# Patient Record
Sex: Male | Born: 1955 | Race: White | Hispanic: No | Marital: Married | Smoking: Never smoker
Health system: Southern US, Community
[De-identification: ages and names within clinical notes are randomized; demographics above are authoritative.]

## PROBLEM LIST (undated history)

## (undated) DIAGNOSIS — E7800 Pure hypercholesterolemia, unspecified: Secondary | ICD-10-CM

## (undated) HISTORY — PX: TRIGGER FINGER RELEASE: SHX641

## (undated) HISTORY — PX: VASECTOMY: SHX75

## (undated) HISTORY — PX: BUNIONECTOMY: SHX129

---

## 1998-06-01 ENCOUNTER — Emergency Department (HOSPITAL_COMMUNITY): Admission: EM | Admit: 1998-06-01 | Discharge: 1998-06-01 | Payer: Self-pay | Admitting: Emergency Medicine

## 2008-05-20 ENCOUNTER — Emergency Department (HOSPITAL_BASED_OUTPATIENT_CLINIC_OR_DEPARTMENT_OTHER): Admission: EM | Admit: 2008-05-20 | Discharge: 2008-05-20 | Payer: Self-pay | Admitting: Emergency Medicine

## 2015-09-09 ENCOUNTER — Encounter: Payer: Self-pay | Admitting: Podiatry

## 2015-09-09 ENCOUNTER — Ambulatory Visit: Payer: 59

## 2015-09-09 ENCOUNTER — Ambulatory Visit (INDEPENDENT_AMBULATORY_CARE_PROVIDER_SITE_OTHER): Payer: 59

## 2015-09-09 ENCOUNTER — Ambulatory Visit (INDEPENDENT_AMBULATORY_CARE_PROVIDER_SITE_OTHER): Payer: 59 | Admitting: Podiatry

## 2015-09-09 VITALS — BP 112/76 | HR 76 | Resp 16 | Ht 70.0 in | Wt 190.0 lb

## 2015-09-09 DIAGNOSIS — M1000 Idiopathic gout, unspecified site: Secondary | ICD-10-CM

## 2015-09-09 DIAGNOSIS — M21619 Bunion of unspecified foot: Secondary | ICD-10-CM

## 2015-09-09 MED ORDER — ALLOPURINOL 100 MG PO TABS: 100.0000 mg | ORAL_TABLET | Freq: Every day | ORAL | Status: AC

## 2015-09-09 NOTE — Patient Instructions (Signed)

## 2015-09-09 NOTE — Progress Notes (Signed)
   Subjective:    Patient ID: Dylan Rodriguez, male    DOB: 16-Oct-1956, 59 y.o.   MRN: [id number]  HPI Patient presents with bilateral bunions. Pt stated, "Right foot hurts"; x10 years.  Review of Systems  Psychiatric/Behavioral: The patient is nervous/anxious.   All other systems reviewed and are negative.      Objective:   Physical Exam        Assessment & Plan:

## 2015-09-11 NOTE — Progress Notes (Signed)
Subjective:     Patient ID: Dylan Rodriguez, male   DOB: March 09, 1956, 59 y.o.   MRN: [id number]  HPI patient presents stating I have a bunion deformity of my right foot that bothers me and also I get a proximally 6 times year all get redness and irritation around my big toe joint right over left foot. I do have family history of structural bunion deformity   Review of Systems  All other systems reviewed and are negative.      Objective:   Physical Exam  Constitutional: He is oriented to person, place, and time.  Cardiovascular: Intact distal pulses.   Musculoskeletal: Normal range of motion.  Neurological: He is oriented to person, place, and time.  Skin: Skin is warm.  Nursing note and vitals reviewed.  neurovascular status found to be intact muscle strength adequate range of motion within normal limits. Patient's found to have redness and pain around the first metatarsal head right with enlargement of the joint surface and deviation the big toe against the second toe. He also has history of occasional flareups and redness around this joint surface which gets painful when pressed. 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 I have recommended at this time structural bunion correction due to long-standing nature and pain along with starting low-dose allopurinol to see results and make a decision on what we'll be best. Patient does 1 structural bunion correction and will reappoint for consult to discuss what would be required to fix this deformity

## 2015-09-25 ENCOUNTER — Encounter: Payer: Self-pay | Admitting: Podiatry

## 2015-09-25 ENCOUNTER — Ambulatory Visit (INDEPENDENT_AMBULATORY_CARE_PROVIDER_SITE_OTHER): Payer: 59 | Admitting: Podiatry

## 2015-09-25 DIAGNOSIS — M21619 Bunion of unspecified foot: Secondary | ICD-10-CM

## 2015-09-25 MED ORDER — METHYLPREDNISOLONE 4 MG PO TBPK
ORAL_TABLET | ORAL | Status: DC
Start: 2015-09-25 — End: 2015-10-02

## 2015-09-25 MED ORDER — ALLOPURINOL 100 MG PO TABS: 150.0000 mg | ORAL_TABLET | Freq: Every day | ORAL | Status: DC

## 2015-09-25 NOTE — Patient Instructions (Signed)
Pre-Operative Instructions  Congratulations, you have decided to take an important step to improving your quality of life.  You can be assured that the doctors of [facility] will be with you every step of the way.  1. Plan to be at the surgery center/hospital at least 1 (one) hour prior to your scheduled time unless otherwise directed by the surgical center/hospital staff.  You must have a responsible adult accompany you, remain during the surgery and drive you home.  Make sure you have directions to the surgical center/hospital and know how to get there on time. 2. For hospital based surgery you will need to obtain a history and physical form from your family physician within 1 month prior to the date of surgery- we will give you a form for you primary physician.  3. We make every effort to accommodate the date you request for surgery.  There are however, times where surgery dates or times have to be moved.  We will contact you as soon as possible if a change in schedule is required.   4. No Aspirin/Ibuprofen for one week before surgery.  If you are on aspirin, any non-steroidal anti-inflammatory medications (Mobic, Aleve, Ibuprofen) you should stop taking it 7 days prior to your surgery.  You make take Tylenol  For pain prior to surgery.  5. Medications- If you are taking daily heart and blood pressure medications, seizure, reflux, allergy, asthma, anxiety, pain or diabetes medications, make sure the surgery center/hospital is aware before the day of surgery so they may notify you which medications to take or avoid the day of surgery. 6. No food or drink after midnight the night before surgery unless directed otherwise by surgical center/hospital staff. 7. No alcoholic beverages 24 hours prior to surgery.  No smoking 24 hours prior to or 24 hours after surgery. 8. Wear loose pants or shorts- loose enough to fit over bandages, boots, and casts. 9. No slip on shoes, sneakers are best. 10. Bring  your boot with you to the surgery center/hospital.  Also bring crutches or a walker if your physician has prescribed it for you.  If you do not have this equipment, it will be provided for you after surgery. 11. If you have not been contracted by the surgery center/hospital by the day before your surgery, call to confirm the date and time of your surgery. 12. Leave-time from work may vary depending on the type of surgery you have.  Appropriate arrangements should be made prior to surgery with your employer. 13. Prescriptions will be provided immediately following surgery by your doctor.  Have these filled as soon as possible after surgery and take the medication as directed. 14. Remove nail polish on the operative foot. 15. Wash the night before surgery.  The night before surgery wash the foot and leg well with the antibacterial soap provided and water paying special attention to beneath the toenails and in between the toes.  Rinse thoroughly with water and dry well with a towel.  Perform this wash unless told not to do so by your physician.  Enclosed: 1 Ice pack (please put in freezer the night before surgery)   1 Hibiclens skin cleaner   Pre-op Instructions  If you have any questions regarding the instructions, do not hesitate to call our office.  [place]: [address] [place], [place] [address] [phone]  [place]: [address] [place], [place] [address] [phone]  [place]: [address]  [place], [place] [address] [phone]  Dr. Richard   Tuchman DPM, Dr. Norman Regal DPM Dr. Richard Sikora DPM, Dr. M. Todd Hyatt DPM, Dr. Kathryn Egerton DPM 

## 2015-09-25 NOTE — Progress Notes (Signed)
Subjective:     Patient ID: Dylan Rodriguez, male   DOB: [birth year], 59 y.o.   MRN: [id number]  HPI patient states I'm ready to get this bunion fixed as it's been sore and I have had a repeated gout attack right second toe. Patient states that he has tried wider shoes she's tried soaks and he's tried injection treatment without relief of symptoms around the big toe joint with large bunion deformity noted   Review of Systems     Objective:   Physical Exam Neurovascular status intact muscle strength adequate with large hyperostosis medial aspect first metatarsal head right that's red when pressed and inflamed second digit distal interphalangeal joint that is localized and is not currently tender but is quite discolored    Assessment:     Possibility for rebound gout attack versus inflammatory or other unknown condition second toe right with structural bunion deformity right that's failed to respond to conservative care    Plan:     Reviewed x-rays and condition and discussed at great length and saturations for treatment including surgical intervention. Patient wants this fixed surgically and I reviewed alternative treatments complications and the fact that he still may develop consistent gout attacks in the future. I am getting increase him to 150 mg on his allopurinol and I placed him on a steroid pack at this time to try to reduce inflammation and I'm also sending him out for a uric acid and arthritic profile along with CBC and differential. Patient will be seen back in 6 days and is scheduled for surgery in December and I did dispense air fracture walker at the current time  X-ray report indicates large structural bunion deformity right with elevation of the ankle between the first and second metatarsal of 15 and deviation of the hallux against the second toe

## 2015-09-26 LAB — ANA: Anti Nuclear Antibody(ANA): NEGATIVE

## 2015-09-26 LAB — CBC WITH DIFFERENTIAL/PLATELET
BASOS ABS: 0.1 10*3/uL (ref 0.0–0.1)
BASOS PCT: 1 % (ref 0–1)
EOS ABS: 0.2 10*3/uL (ref 0.0–0.7)
Eosinophils Relative: 3 % (ref 0–5)
HCT: 49.8 % (ref 39.0–52.0)
Hemoglobin: 16.6 g/dL (ref 13.0–17.0)
LYMPHS ABS: 1.8 10*3/uL (ref 0.7–4.0)
Lymphocytes Relative: 31 % (ref 12–46)
MCH: 31.3 pg (ref 26.0–34.0)
MCHC: 33.3 g/dL (ref 30.0–36.0)
MCV: 94 fL (ref 78.0–100.0)
MPV: 10.7 fL (ref 8.6–12.4)
Monocytes Absolute: 0.7 10*3/uL (ref 0.1–1.0)
Monocytes Relative: 12 % (ref 3–12)
NEUTROS PCT: 53 % (ref 43–77)
Neutro Abs: 3 10*3/uL (ref 1.7–7.7)
PLATELETS: 234 10*3/uL (ref 150–400)
RBC: 5.3 MIL/uL (ref 4.22–5.81)
RDW: 13.4 % (ref 11.5–15.5)
WBC: 5.7 10*3/uL (ref 4.0–10.5)

## 2015-09-26 LAB — RHEUMATOID FACTOR

## 2015-09-26 LAB — SEDIMENTATION RATE: SED RATE: 4 mm/h (ref 0–20)

## 2015-09-26 LAB — URIC ACID: Uric Acid, Serum: 6.8 mg/dL (ref 4.0–7.8)

## 2015-10-01 ENCOUNTER — Telehealth: Payer: Self-pay | Admitting: *Deleted

## 2015-10-01 NOTE — Telephone Encounter (Signed)
Authorization was obtained for surgery scheduled for 10/15/2015 for cpt 28296.  Authorization number is [id number].  Authorization number was faxed to Caren Griffins at [facility].

## 2015-10-02 ENCOUNTER — Encounter: Payer: Self-pay | Admitting: Podiatry

## 2015-10-02 ENCOUNTER — Ambulatory Visit (INDEPENDENT_AMBULATORY_CARE_PROVIDER_SITE_OTHER): Payer: 59 | Admitting: Podiatry

## 2015-10-02 VITALS — BP 119/81 | HR 61 | Resp 16

## 2015-10-02 DIAGNOSIS — M21619 Bunion of unspecified foot: Secondary | ICD-10-CM | POA: Diagnosis not present

## 2015-10-02 DIAGNOSIS — M1000 Idiopathic gout, unspecified site: Secondary | ICD-10-CM | POA: Diagnosis not present

## 2015-10-03 NOTE — Progress Notes (Signed)
Subjective:     Patient ID: Dylan Rodriguez, male   DOB: 1956-07-22, 59 y.o.   MRN: [id number]  HPI patient presents stating my second toe is doing much better but I wanted to review blood work and again go over the surgery I'm having   Review of Systems     Objective:   Physical Exam Neurovascular status intact with large hyperostosis with redness medial aspect first metatarsal head right with pain and a well-healed second digit distal joint that has no current redness or swelling    Assessment:     Probability for some inflammatory systemic condition that appears to be under good control now    Plan:     Reviewed condition and at this time reviewed blood work indicating the uric acid and all other inflammatory components were within normal limits. Patient will have surgery done and I reviewed again structural bunion correction and everything involved with it and he is scheduled for this surgery to be done for weeks

## 2015-10-15 ENCOUNTER — Encounter: Payer: Self-pay | Admitting: Podiatry

## 2015-10-15 DIAGNOSIS — M2011 Hallux valgus (acquired), right foot: Secondary | ICD-10-CM | POA: Diagnosis not present

## 2015-10-25 ENCOUNTER — Ambulatory Visit (INDEPENDENT_AMBULATORY_CARE_PROVIDER_SITE_OTHER): Payer: 59 | Admitting: Podiatry

## 2015-10-25 ENCOUNTER — Ambulatory Visit: Payer: Self-pay

## 2015-10-25 VITALS — Temp 96.9°F

## 2015-10-25 DIAGNOSIS — Z9889 Other specified postprocedural states: Secondary | ICD-10-CM

## 2015-10-25 DIAGNOSIS — M21619 Bunion of unspecified foot: Secondary | ICD-10-CM

## 2015-10-25 MED ORDER — HYDROCODONE-ACETAMINOPHEN 10-325 MG PO TABS: 1.0000 | ORAL_TABLET | Freq: Three times a day (TID) | ORAL | Status: DC | PRN

## 2015-11-03 NOTE — Progress Notes (Signed)
Subjective:     Patient ID: Dylan Rodriguez, male   DOB: 1955-11-14, 59 y.o.   MRN: [id number]  HPI patient states she's doing real well with the right foot with diminished discomfort swelling and able to walk without significant pain or swelling   Review of Systems     Objective:   Physical Exam  neurovascular status intact negative Homans sign noted with well coapted incision site right first metatarsal with good alignment noted    Assessment:      doing well with osteotomy first metatarsal right with gout the digits show up on the pathology    Plan:      H&P reviewed and allow patient to gradually increase activity over the next few weeks but continue immobilization compression elevation. Reappoint 4 weeks or earlier if any issues should occur

## 2015-11-07 NOTE — Progress Notes (Signed)
DOS 10/15/2015 [place] bunionectomy (cutting and moving bone with pin fixation or screw) right foot.

## 2015-11-22 ENCOUNTER — Ambulatory Visit (INDEPENDENT_AMBULATORY_CARE_PROVIDER_SITE_OTHER): Payer: 59 | Admitting: Podiatry

## 2015-11-22 ENCOUNTER — Ambulatory Visit (INDEPENDENT_AMBULATORY_CARE_PROVIDER_SITE_OTHER): Payer: 59

## 2015-11-22 DIAGNOSIS — Z9889 Other specified postprocedural states: Secondary | ICD-10-CM

## 2015-11-22 DIAGNOSIS — M21619 Bunion of unspecified foot: Secondary | ICD-10-CM | POA: Diagnosis not present

## 2015-11-24 NOTE — Progress Notes (Signed)
Subjective:     Patient ID: Dylan Rodriguez, male   DOB: 30-Mar-1956, 60 y.o.   MRN: [id number]  HPI patient states that I'm doing very well with minimal swelling or pain   Review of Systems     Objective:   Physical Exam Neurological status intact negative Homans sign well coapted site first metatarsal with wound edges in good alignment and first metatarsal bending well    Assessment:     Doing well post surgery right    Plan:     X-ray reviewed and allow patient to return to normal activity explaining continued elevation compression as needed. Reappoint 4 weeks

## 2015-11-25 ENCOUNTER — Other Ambulatory Visit: Payer: 59

## 2016-01-03 ENCOUNTER — Ambulatory Visit (INDEPENDENT_AMBULATORY_CARE_PROVIDER_SITE_OTHER): Payer: 59 | Admitting: Podiatry

## 2016-01-03 ENCOUNTER — Encounter: Payer: Self-pay | Admitting: Podiatry

## 2016-01-03 DIAGNOSIS — M21619 Bunion of unspecified foot: Secondary | ICD-10-CM

## 2016-01-03 DIAGNOSIS — Z9889 Other specified postprocedural states: Secondary | ICD-10-CM

## 2016-01-05 NOTE — Progress Notes (Signed)
Subjective:     Patient ID: Dylan Rodriguez, male   DOB: Apr 22, 1956, 60 y.o.   MRN: [id number]  HPI patient states I'm doing great with my right foot   Review of Systems     Objective:   Physical Exam Neurovascular status intact muscle strength adequate with well-healing surgical site right first metatarsal with wound edges well coapted and hallux in rectus position with good range of motion    Assessment:     Doing well post osteotomy right first metatarsal    Plan:     Advised on physical therapy for this to be continued reviewed final x-rays and allow patient to return to normal shoe gear and will be seen back as needed  Report indicate good healing of the osteotomy with good alignment and pins in place

## 2017-01-03 ENCOUNTER — Emergency Department (HOSPITAL_BASED_OUTPATIENT_CLINIC_OR_DEPARTMENT_OTHER): Payer: Worker's Compensation

## 2017-01-03 ENCOUNTER — Encounter (HOSPITAL_BASED_OUTPATIENT_CLINIC_OR_DEPARTMENT_OTHER): Payer: Self-pay | Admitting: Emergency Medicine

## 2017-01-03 ENCOUNTER — Emergency Department (HOSPITAL_BASED_OUTPATIENT_CLINIC_OR_DEPARTMENT_OTHER)
Admission: EM | Admit: 2017-01-03 | Discharge: 2017-01-03 | Disposition: A | Discharge status: Home / Self Care | Payer: Worker's Compensation | Attending: Emergency Medicine | Admitting: Emergency Medicine

## 2017-01-03 DIAGNOSIS — S59901A Unspecified injury of right elbow, initial encounter: Secondary | ICD-10-CM | POA: Diagnosis present

## 2017-01-03 DIAGNOSIS — S5001XA Contusion of right elbow, initial encounter: Secondary | ICD-10-CM | POA: Diagnosis not present

## 2017-01-03 DIAGNOSIS — W1809XA Striking against other object with subsequent fall, initial encounter: Secondary | ICD-10-CM | POA: Diagnosis not present

## 2017-01-03 DIAGNOSIS — Y999 Unspecified external cause status: Secondary | ICD-10-CM | POA: Diagnosis not present

## 2017-01-03 DIAGNOSIS — M25421 Effusion, right elbow: Secondary | ICD-10-CM

## 2017-01-03 DIAGNOSIS — Y939 Activity, unspecified: Secondary | ICD-10-CM | POA: Diagnosis not present

## 2017-01-03 DIAGNOSIS — Y929 Unspecified place or not applicable: Secondary | ICD-10-CM | POA: Diagnosis not present

## 2017-01-03 MED ORDER — SULFAMETHOXAZOLE-TRIMETHOPRIM 800-160 MG PO TABS: 1.0000 | ORAL_TABLET | Freq: Two times a day (BID) | ORAL | 0 refills | Status: AC

## 2017-01-03 NOTE — ED Provider Notes (Signed)
[place] DEPT [facility] Provider Note   CSN: [id number] Arrival date & time: 01/03/17  1030     History   Chief Complaint Chief Complaint  Patient presents with  . Laceration    HPI Dylan Rodriguez is a 61 y.o. male who presents to the ED for possible wound infection. Patient reports that he injured his elbow a few days ago and it was closed with dermabond. Today he has redness that he is afraid is infection.  HPI  History reviewed. No pertinent past medical history.  There are no active problems to display for this patient.   Past Surgical History:  Procedure Laterality Date  . BUNIONECTOMY Right   . VASECTOMY         Home Medications    Prior to Admission medications   Medication Sig Start Date End Date Taking? Authorizing Provider  pravastatin (PRAVACHOL) 20 MG tablet Take 20 mg by mouth daily. 07/30/15  Yes Historical Provider, MD  sertraline (ZOLOFT) 25 MG tablet Take 25 mg by mouth daily. 07/29/15  Yes Historical Provider, MD  allopurinol (ZYLOPRIM) 100 MG tablet Take 1 tablet (100 mg total) by mouth daily. 09/09/15   Wallene Huh, DPM  allopurinol (ZYLOPRIM) 100 MG tablet Take 1.5 tablets (150 mg total) by mouth daily. 09/25/15   Wallene Huh, DPM  HYDROcodone-acetaminophen (NORCO) 10-325 MG tablet Take 1 tablet by mouth every 8 (eight) hours as needed. 10/25/15   Wallene Huh, DPM  oxyCODONE-acetaminophen (PERCOCET) 10-325 MG tablet Take 1 tablet by mouth every 4 (four) hours as needed for pain.    Wallene Huh, DPM  sulfamethoxazole-trimethoprim (BACTRIM DS,SEPTRA DS) 800-160 MG tablet Take 1 tablet by mouth 2 (two) times daily. 01/03/17 01/10/17  Dailyn Reith Bunnie Pion, NP    Family History No family history on file.  Social History Social History  Substance Use Topics  . Smoking status: Never Smoker  . Smokeless tobacco: Never Used  . Alcohol use No     Allergies   Patient has no known allergies.   Review of Systems Review of Systems   Physical  Exam Updated Vital Signs BP 142/88 (BP Location: Right Arm)   Pulse 64   Temp 98.5 F (36.9 C) (Oral)   Resp 18   SpO2 97%   Physical Exam  Constitutional: He appears well-developed and well-nourished. No distress.  HENT:  Head: Normocephalic.  Eyes: EOM are normal.  Neck: Neck supple.  Cardiovascular: Normal rate.   Pulmonary/Chest: Effort normal.  Musculoskeletal: Normal range of motion.       Right elbow: He exhibits swelling. Lacerations: abrasion. Tenderness found. Olecranon process tenderness noted.  Neurological: He is alert.  Skin: Skin is warm and dry.  Psychiatric: He has a normal mood and affect.  Nursing note and vitals reviewed.    ED Treatments / Results  Labs (all labs ordered are listed, but only abnormal results are displayed) Labs Reviewed - No data to display  Radiology Dg Elbow Complete Right  Result Date: 01/03/2017 CLINICAL DATA:  Was injured on Monday and hit right elbow. Wound to right elbow. Wound is red and hot per patient. Right elbow swelling. EXAM: RIGHT ELBOW - COMPLETE 3+ VIEW COMPARISON:  None. FINDINGS: No fracture.  No bone lesion. The elbow joint is normally spaced and aligned. No arthropathic change. No joint effusion. There is posterior soft tissue swelling.  No soft tissue air. No evidence of osteomyelitis. IMPRESSION: 1. No fracture or elbow joint abnormality. No evidence of osteomyelitis.  2. Posterior soft tissue swelling.  No soft tissue air. Electronically Signed   By: Lajean Manes M.D.   On: 01/03/2017 12:17    Procedures Procedures (including critical care time)  Medications Ordered in ED Medications - No data to display   Initial Impression / Assessment and Plan / ED Course  I have reviewed the triage vital signs and the nursing notes.  Pertinent imaging results that were available during my care of the patient were reviewed by me and considered in my medical decision making (see chart for details). Dr. Johnney Killian in to  examine the patient and agrees with a/p.   Final Clinical Impressions(s) / ED Diagnoses  61 y.o. male with swelling and tenderness of the right elbow s/p injury a few days ago stable for d/c without fever. X-ray without fracture, dislocation and no osteo. Will treat for traumatic bursitis and cover for infection. Discussed with patient and he agrees with plan.  Final diagnoses:  Contusion of right elbow, initial encounter  Swelling of right elbow    New Prescriptions New Prescriptions   SULFAMETHOXAZOLE-TRIMETHOPRIM (BACTRIM DS,SEPTRA DS) 800-160 MG TABLET    Take 1 tablet by mouth 2 (two) times daily.     [address] [place], NP 01/03/17 [place], MD 01/13/17 [phone]

## 2017-01-03 NOTE — ED Triage Notes (Addendum)
Was injured on Monday and hit right elbow. No LOC, concerned that right elbow wound is infected because area is red, was treated with dermabond. Scabbed area noted to right elbow, with swelling noted

## 2017-01-03 NOTE — ED Notes (Signed)
Patient transported to Ultrasound 

## 2017-01-15 ENCOUNTER — Encounter (HOSPITAL_BASED_OUTPATIENT_CLINIC_OR_DEPARTMENT_OTHER): Payer: Self-pay | Admitting: *Deleted

## 2017-01-15 ENCOUNTER — Emergency Department (HOSPITAL_BASED_OUTPATIENT_CLINIC_OR_DEPARTMENT_OTHER)
Admission: EM | Admit: 2017-01-15 | Discharge: 2017-01-15 | Disposition: A | Discharge status: Home / Self Care | Payer: 59 | Attending: Emergency Medicine | Admitting: Emergency Medicine

## 2017-01-15 DIAGNOSIS — S0501XA Injury of conjunctiva and corneal abrasion without foreign body, right eye, initial encounter: Secondary | ICD-10-CM | POA: Insufficient documentation

## 2017-01-15 DIAGNOSIS — W228XXA Striking against or struck by other objects, initial encounter: Secondary | ICD-10-CM | POA: Insufficient documentation

## 2017-01-15 DIAGNOSIS — Y9389 Activity, other specified: Secondary | ICD-10-CM | POA: Insufficient documentation

## 2017-01-15 DIAGNOSIS — H43391 Other vitreous opacities, right eye: Secondary | ICD-10-CM | POA: Diagnosis not present

## 2017-01-15 DIAGNOSIS — Z79899 Other long term (current) drug therapy: Secondary | ICD-10-CM | POA: Diagnosis not present

## 2017-01-15 DIAGNOSIS — Y929 Unspecified place or not applicable: Secondary | ICD-10-CM | POA: Diagnosis not present

## 2017-01-15 DIAGNOSIS — Y999 Unspecified external cause status: Secondary | ICD-10-CM | POA: Insufficient documentation

## 2017-01-15 DIAGNOSIS — S0591XA Unspecified injury of right eye and orbit, initial encounter: Secondary | ICD-10-CM | POA: Diagnosis not present

## 2017-01-15 DIAGNOSIS — H04129 Dry eye syndrome of unspecified lacrimal gland: Secondary | ICD-10-CM | POA: Diagnosis not present

## 2017-01-15 DIAGNOSIS — H1031 Unspecified acute conjunctivitis, right eye: Secondary | ICD-10-CM | POA: Diagnosis not present

## 2017-01-15 HISTORY — DX: Pure hypercholesterolemia, unspecified: E78.00

## 2017-01-15 MED ORDER — TETRACAINE HCL 0.5 % OP SOLN
OPHTHALMIC | Status: AC
  Administered 2017-01-15: 1 [drp]
  Filled 2017-01-15: qty 4

## 2017-01-15 MED ORDER — FLUORESCEIN SODIUM 0.6 MG OP STRP
1.0000 | ORAL_STRIP | Freq: Once | OPHTHALMIC | Status: AC
  Administered 2017-01-15: 1 via OPHTHALMIC

## 2017-01-15 MED ORDER — TETRACAINE HCL 0.5 % OP SOLN
1.0000 [drp] | Freq: Once | OPHTHALMIC | Status: AC
  Administered 2017-01-15: 1 [drp] via OPHTHALMIC

## 2017-01-15 NOTE — ED Triage Notes (Signed)
He was cutting wood and got a FB in his left eye. Tearing, redness and pain. Blurred vision. No relief with heat, irrigation.

## 2017-01-15 NOTE — ED Provider Notes (Signed)
[place] DEPT [facility] Provider Note   CSN: [id number] Arrival date & time: 01/15/17  1207     History   Chief Complaint Chief Complaint  Patient presents with  . Eye Problem    HPI Dylan Rodriguez is a 61 y.o. male.  HPI   Dylan Rodriguez is a 61 y.o. male, patient with no pertinent past medical history, presenting to the ED with a right eye injury that occurred shortly prior to arrival. Patient states he was cutting wood with a chain saw when a piece of wood flew up into his right eye. Patient was wearing sunglasses at the time. He had a foreign body sensation in the eye. He attempted to rinse the eye out and also applied lubrication drops, without relief. Pain is sharp/burning, 4 out of 10, nonradiating. He denies contact lens wear or eye surgery. Denies visual acuity changes, other injuries, or any other complaints.   Past Medical History:  Diagnosis Date  . High cholesterol     There are no active problems to display for this patient.   Past Surgical History:  Procedure Laterality Date  . BUNIONECTOMY Right   . VASECTOMY         Home Medications    Prior to Admission medications   Medication Sig Start Date End Date Taking? Authorizing Provider  pravastatin (PRAVACHOL) 20 MG tablet Take 20 mg by mouth daily. 07/30/15  Yes Historical Provider, MD  allopurinol (ZYLOPRIM) 100 MG tablet Take 1 tablet (100 mg total) by mouth daily. 09/09/15   Wallene Huh, DPM  allopurinol (ZYLOPRIM) 100 MG tablet Take 1.5 tablets (150 mg total) by mouth daily. 09/25/15   Wallene Huh, DPM  HYDROcodone-acetaminophen (NORCO) 10-325 MG tablet Take 1 tablet by mouth every 8 (eight) hours as needed. 10/25/15   Wallene Huh, DPM  oxyCODONE-acetaminophen (PERCOCET) 10-325 MG tablet Take 1 tablet by mouth every 4 (four) hours as needed for pain.    Wallene Huh, DPM  sertraline (ZOLOFT) 25 MG tablet Take 25 mg by mouth daily. 07/29/15   Historical Provider, MD    Family History No  family history on file.  Social History Social History  Substance Use Topics  . Smoking status: Never Smoker  . Smokeless tobacco: Never Used  . Alcohol use No     Allergies   Patient has no known allergies.   Review of Systems Review of Systems  Eyes: Positive for pain and redness. Negative for visual disturbance.  Neurological: Negative for headaches.     Physical Exam Updated Vital Signs BP 129/91   Pulse (!) 58   Temp 98.2 F (36.8 C) (Oral)   Resp 20   Ht 5\' 10"  (1.778 m)   Wt 86.2 kg   SpO2 99%   BMI 27.26 kg/m   Physical Exam  Constitutional: He appears well-developed and well-nourished. No distress.  HENT:  Head: Normocephalic and atraumatic.  Eyes: EOM are normal. Pupils are equal, round, and reactive to light.  Scleral injection in right eye. Tearing noted, but no abnormal discharge.  No contact lenses in place.  Woods Lamp exam shows increased uptake of fluorescein around the 9 o'clock position of the right cornea. Slit lamp exam was also performed with signs of corneal abrasion confirmed at 9:00 position of the right cornea. No noted signs of corneal ulcer, iritis, anterior chamber damage, foreign bodies, or globe damage.  Tono-Pen values: Right eye: 16  Left eye: 15    Visual Acuity  Right Eye Distance: 20/20 Left Eye Distance: 20/15 Bilateral Distance: 20/13  Right Eye Near:   Left Eye Near:    Bilateral Near:      Neck: Neck supple.  Cardiovascular: Normal rate and regular rhythm.   Pulmonary/Chest: Effort normal.  Neurological: He is alert.  Skin: Skin is warm and dry. He is not diaphoretic.  Psychiatric: He has a normal mood and affect. His behavior is normal.  Nursing note and vitals reviewed.    ED Treatments / Results  Labs (all labs ordered are listed, but only abnormal results are displayed) Labs Reviewed - No data to display  EKG  EKG Interpretation None       Radiology No results found.  Procedures Procedures  (including critical care time)  Medications Ordered in ED Medications  tetracaine (PONTOCAINE) 0.5 % ophthalmic solution (1 drop  Given 01/15/17 1231)  tetracaine (PONTOCAINE) 0.5 % ophthalmic solution 1 drop (1 drop Right Eye Given 01/15/17 1232)  fluorescein ophthalmic strip 1 strip (1 strip Left Eye Given 01/15/17 1359)     Initial Impression / Assessment and Plan / ED Course  I have reviewed the triage vital signs and the nursing notes.  Pertinent labs & imaging results that were available during my care of the patient were reviewed by me and considered in my medical decision making (see chart for details).     Patient presents with right eye injury with a corneal abrasion noted on exam. No foreign body noted, however, patient continues to have intense foreign body sensation. Ophthalmology follow-up. An appointment was set up for the patient with Dr. Manuella Ghazi for 2:20 PM today, following discharge. This is to assure no foreign bodies are actually present. This information was communicated with the patient. Patient agreed to the plan.  Vitals:   01/15/17 1218 01/15/17 1220 01/15/17 1359  BP:  129/91 141/85  Pulse:  (!) 58 61  Resp:  20 18  Temp:  98.2 F (36.8 C)   TempSrc:  Oral   SpO2:  99% 100%  Weight: 86.2 kg    Height: 5\' 10"  (1.778 m)       Final Clinical Impressions(s) / ED Diagnoses   Final diagnoses:  Abrasion of right cornea, initial encounter    New Prescriptions Discharge Medication List as of 01/15/2017  1:55 PM       Lorayne Bender, PA-C 01/15/17 [place], MD 01/17/17 1429

## 2017-01-15 NOTE — Discharge Instructions (Signed)
Dr. Manuella Ghazi will see you today. You have an appointment set up for 2:20PM. Go directly to the address above upon discharge from the ED.

## 2017-01-21 DIAGNOSIS — G4733 Obstructive sleep apnea (adult) (pediatric): Secondary | ICD-10-CM | POA: Diagnosis not present

## 2017-03-04 DIAGNOSIS — M109 Gout, unspecified: Secondary | ICD-10-CM | POA: Diagnosis not present

## 2017-03-04 DIAGNOSIS — E784 Other hyperlipidemia: Secondary | ICD-10-CM | POA: Diagnosis not present

## 2017-03-04 DIAGNOSIS — Z1389 Encounter for screening for other disorder: Secondary | ICD-10-CM | POA: Diagnosis not present

## 2017-03-09 DIAGNOSIS — N183 Chronic kidney disease, stage 3 (moderate): Secondary | ICD-10-CM | POA: Diagnosis not present

## 2017-03-09 DIAGNOSIS — E784 Other hyperlipidemia: Secondary | ICD-10-CM | POA: Diagnosis not present

## 2017-03-10 ENCOUNTER — Other Ambulatory Visit: Payer: Self-pay | Admitting: Internal Medicine

## 2017-03-10 DIAGNOSIS — N183 Chronic kidney disease, stage 3 unspecified: Secondary | ICD-10-CM

## 2017-03-15 ENCOUNTER — Ambulatory Visit
Admission: RE | Admit: 2017-03-15 | Discharge: 2017-03-15 | Disposition: A | Discharge status: Home / Self Care | Payer: 59 | Source: Ambulatory Visit | Attending: Internal Medicine | Admitting: Internal Medicine

## 2017-03-15 DIAGNOSIS — N183 Chronic kidney disease, stage 3 unspecified: Secondary | ICD-10-CM

## 2017-04-15 DIAGNOSIS — D225 Melanocytic nevi of trunk: Secondary | ICD-10-CM | POA: Diagnosis not present

## 2017-05-03 DIAGNOSIS — G4733 Obstructive sleep apnea (adult) (pediatric): Secondary | ICD-10-CM | POA: Diagnosis not present

## 2017-08-05 DIAGNOSIS — G4733 Obstructive sleep apnea (adult) (pediatric): Secondary | ICD-10-CM | POA: Diagnosis not present

## 2017-08-23 DIAGNOSIS — Z23 Encounter for immunization: Secondary | ICD-10-CM | POA: Diagnosis not present

## 2017-11-08 DIAGNOSIS — G4733 Obstructive sleep apnea (adult) (pediatric): Secondary | ICD-10-CM | POA: Diagnosis not present

## 2018-04-07 DIAGNOSIS — R82998 Other abnormal findings in urine: Secondary | ICD-10-CM | POA: Diagnosis not present

## 2018-04-07 DIAGNOSIS — M109 Gout, unspecified: Secondary | ICD-10-CM | POA: Diagnosis not present

## 2018-04-07 DIAGNOSIS — Z0000 Encounter for general adult medical examination without abnormal findings: Secondary | ICD-10-CM | POA: Diagnosis not present

## 2018-04-14 DIAGNOSIS — Z1389 Encounter for screening for other disorder: Secondary | ICD-10-CM | POA: Diagnosis not present

## 2018-04-14 DIAGNOSIS — N183 Chronic kidney disease, stage 3 (moderate): Secondary | ICD-10-CM | POA: Diagnosis not present

## 2018-04-14 DIAGNOSIS — M109 Gout, unspecified: Secondary | ICD-10-CM | POA: Diagnosis not present

## 2018-04-14 DIAGNOSIS — G4733 Obstructive sleep apnea (adult) (pediatric): Secondary | ICD-10-CM | POA: Diagnosis not present

## 2018-04-14 DIAGNOSIS — Z0000 Encounter for general adult medical examination without abnormal findings: Secondary | ICD-10-CM | POA: Diagnosis not present

## 2018-05-02 DIAGNOSIS — G4733 Obstructive sleep apnea (adult) (pediatric): Secondary | ICD-10-CM | POA: Diagnosis not present

## 2018-05-05 DIAGNOSIS — G4733 Obstructive sleep apnea (adult) (pediatric): Secondary | ICD-10-CM | POA: Diagnosis not present

## 2018-05-11 DIAGNOSIS — Z23 Encounter for immunization: Secondary | ICD-10-CM | POA: Diagnosis not present

## 2018-05-13 DIAGNOSIS — M720 Palmar fascial fibromatosis [Dupuytren]: Secondary | ICD-10-CM | POA: Diagnosis not present

## 2018-05-13 DIAGNOSIS — M24549 Contracture, unspecified hand: Secondary | ICD-10-CM | POA: Diagnosis not present

## 2018-06-22 DIAGNOSIS — M24541 Contracture, right hand: Secondary | ICD-10-CM | POA: Diagnosis not present

## 2018-06-22 DIAGNOSIS — M720 Palmar fascial fibromatosis [Dupuytren]: Secondary | ICD-10-CM | POA: Diagnosis not present

## 2018-07-04 DIAGNOSIS — K5730 Diverticulosis of large intestine without perforation or abscess without bleeding: Secondary | ICD-10-CM | POA: Diagnosis not present

## 2018-07-04 DIAGNOSIS — K640 First degree hemorrhoids: Secondary | ICD-10-CM | POA: Diagnosis not present

## 2018-07-04 DIAGNOSIS — Z1211 Encounter for screening for malignant neoplasm of colon: Secondary | ICD-10-CM | POA: Diagnosis not present

## 2018-08-16 DIAGNOSIS — G4733 Obstructive sleep apnea (adult) (pediatric): Secondary | ICD-10-CM | POA: Diagnosis not present

## 2018-08-29 IMAGING — CR DG ELBOW COMPLETE 3+V*R*
4 series · 4 of 4 positions shown · non-contrast
Comparison: None.

CLINICAL DATA: Was injured on [REDACTED] and hit right elbow. Wound to
right elbow. Wound is red and hot per patient. Right elbow swelling.

EXAM:
RIGHT ELBOW - COMPLETE 3+ VIEW

[x elbow joint ap right]
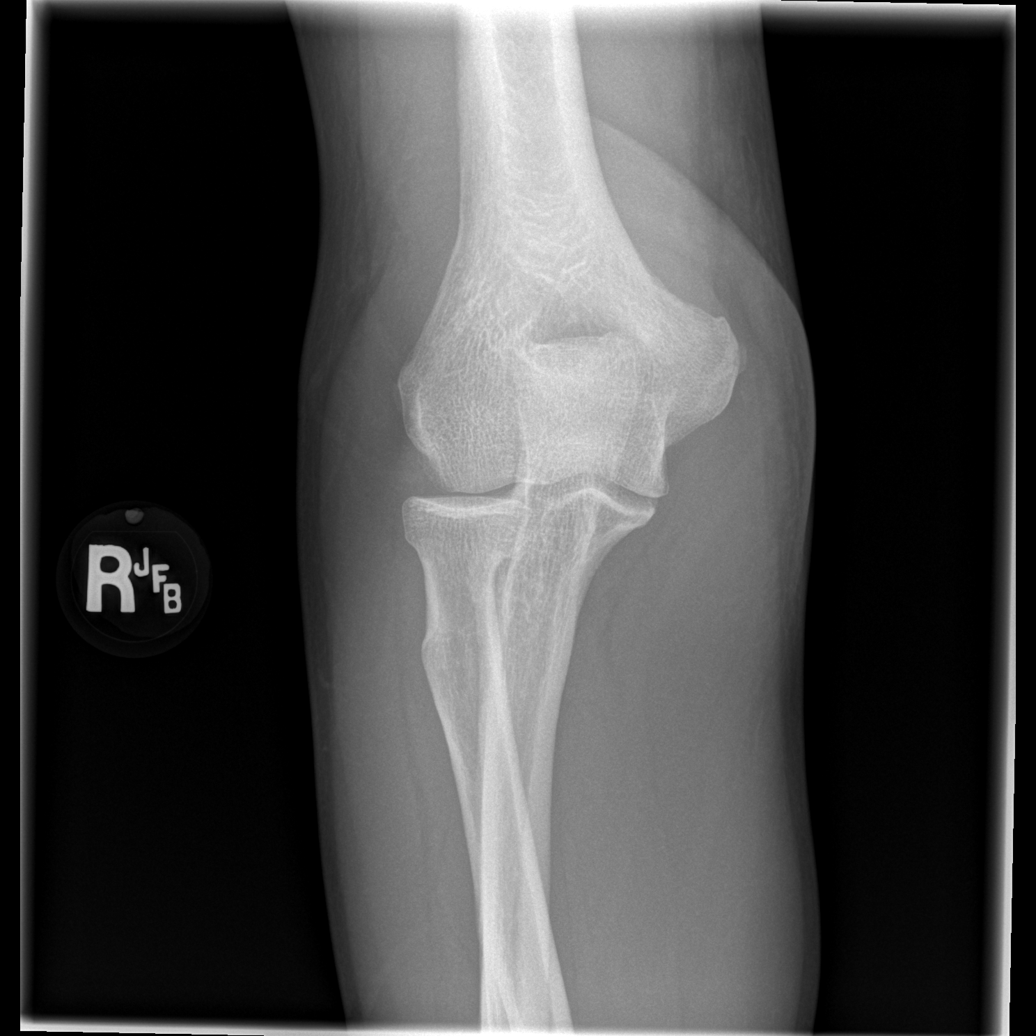

[x elbow joint obl. right (1 of 2)]
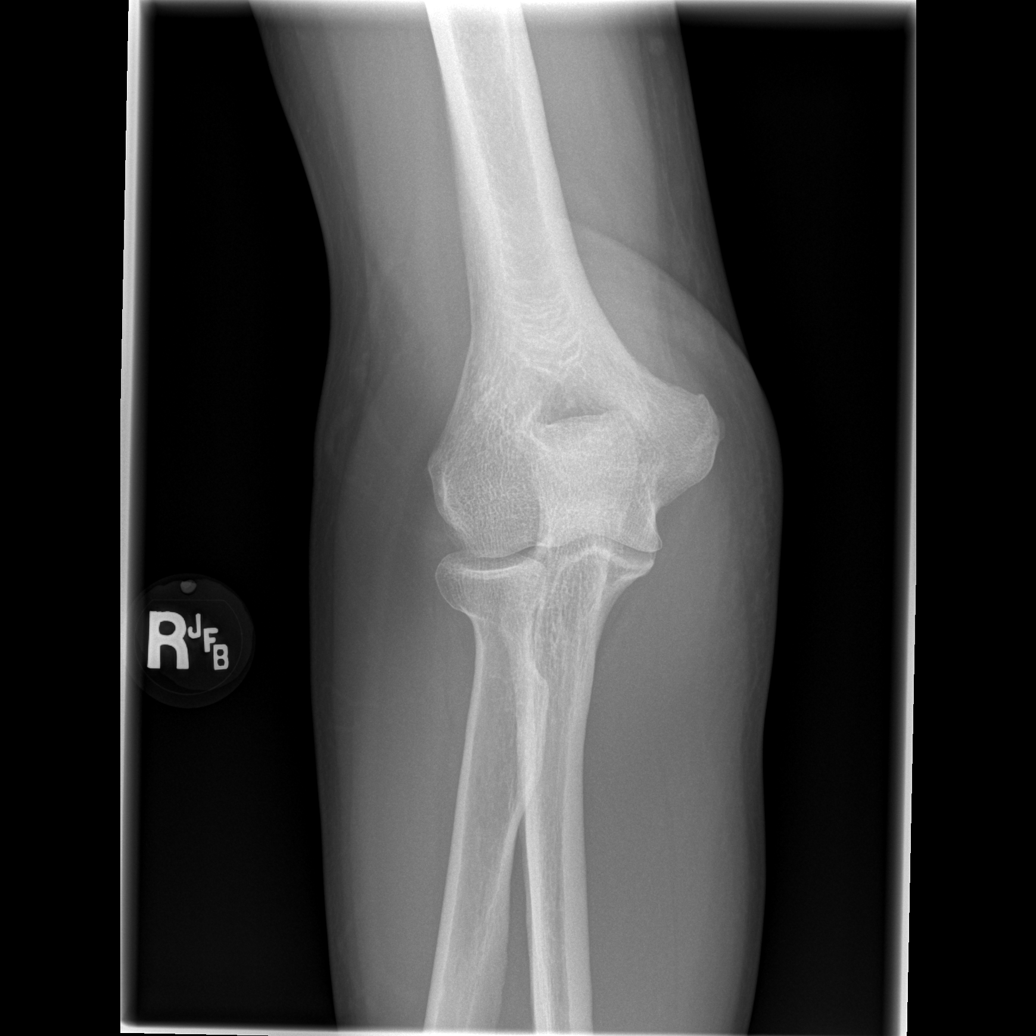

[x elbow joint obl. right (2 of 2)]
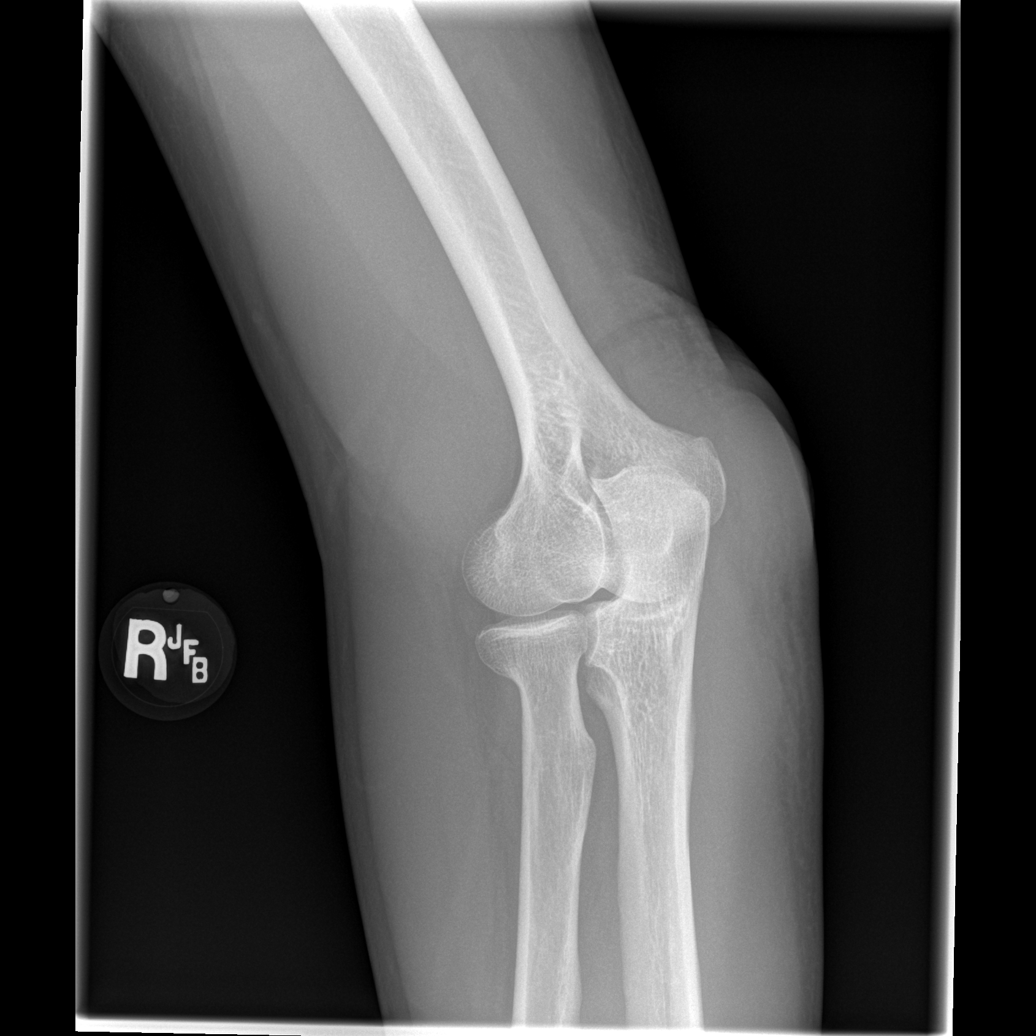

[x elbow joint lat right]
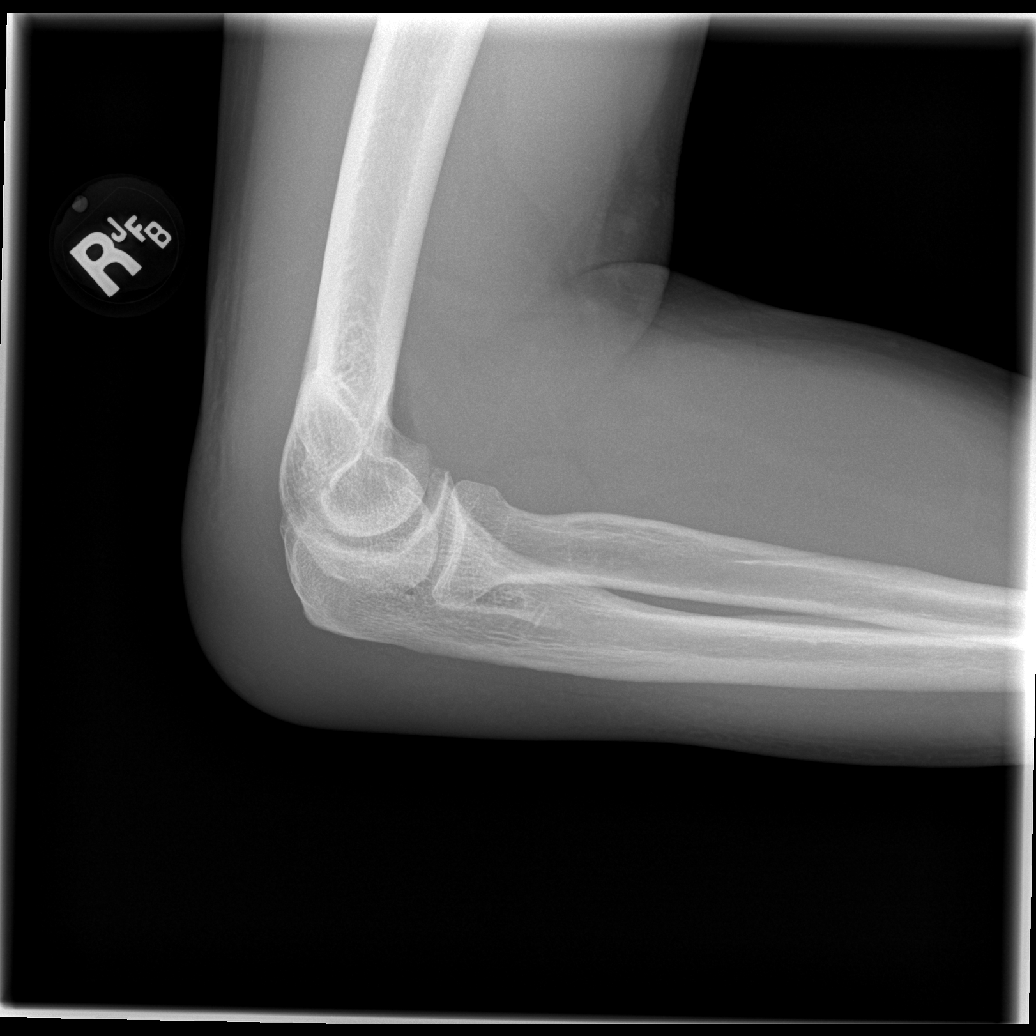

[4 of 4 positions shown; findings below may reference images not displayed]

FINDINGS: No fracture.  No bone lesion.

The elbow joint is normally spaced and aligned. No arthropathic
change. No joint effusion.

There is posterior soft tissue swelling.  No soft tissue air.

No evidence of osteomyelitis.
IMPRESSION: 1. No fracture or elbow joint abnormality. No evidence of
osteomyelitis.
2. Posterior soft tissue swelling.  No soft tissue air.

## 2018-09-05 DIAGNOSIS — Z23 Encounter for immunization: Secondary | ICD-10-CM | POA: Diagnosis not present

## 2018-10-04 DIAGNOSIS — M720 Palmar fascial fibromatosis [Dupuytren]: Secondary | ICD-10-CM | POA: Diagnosis not present

## 2018-10-13 DIAGNOSIS — M79641 Pain in right hand: Secondary | ICD-10-CM | POA: Diagnosis not present

## 2018-11-17 DIAGNOSIS — H903 Sensorineural hearing loss, bilateral: Secondary | ICD-10-CM | POA: Diagnosis not present

## 2018-11-25 DIAGNOSIS — G4733 Obstructive sleep apnea (adult) (pediatric): Secondary | ICD-10-CM | POA: Diagnosis not present

## 2019-03-01 DIAGNOSIS — G4733 Obstructive sleep apnea (adult) (pediatric): Secondary | ICD-10-CM | POA: Diagnosis not present

## 2020-05-10 ENCOUNTER — Other Ambulatory Visit: Payer: Self-pay

## 2020-05-10 ENCOUNTER — Encounter: Payer: Self-pay | Admitting: Cardiology

## 2020-05-10 ENCOUNTER — Ambulatory Visit: Payer: 59 | Admitting: Cardiology

## 2020-05-10 VITALS — BP 126/86 | HR 64 | Ht 70.0 in | Wt 202.0 lb

## 2020-05-10 DIAGNOSIS — E782 Mixed hyperlipidemia: Secondary | ICD-10-CM

## 2020-05-10 DIAGNOSIS — R072 Precordial pain: Secondary | ICD-10-CM

## 2020-05-10 DIAGNOSIS — R002 Palpitations: Secondary | ICD-10-CM

## 2020-05-10 DIAGNOSIS — G4733 Obstructive sleep apnea (adult) (pediatric): Secondary | ICD-10-CM

## 2020-05-10 DIAGNOSIS — R0609 Other forms of dyspnea: Secondary | ICD-10-CM

## 2020-05-10 NOTE — Progress Notes (Signed)
Date:  05/10/2020   ID:  Dylan Rodriguez, DOB Feb 27, 1956, MRN [id number]  PCP:  Crist Infante, MD  Cardiologist:  Rex Kras, DO, [facility] (established care 05/10/2020)  REASON FOR CONSULT: Atypical chest pain and heart flutter  REQUESTING PHYSICIAN:  Crist Infante, MD [address] [place],  [place] [address]  Chief Complaint  Patient presents with  . Palpitations  . Chest Discomfort  . New Patient (Initial Visit)  . Dizziness  . Shortness of Breath    HPI  Dylan Rodriguez is a 64 y.o. male who presents to the office with a chief complaint of " chest discomfort, palpitations, dizziness." He is referred to the office at the request of Crist Infante, MD.  No significant past cardiac history except hyperlipidemia.  Chest discomfort: Patient states his symptoms of chest discomfort started occurring 6 months ago.  The symptoms are nonprogressive.  Located substernally describes it as a flutter/bubble like sensation in the substernal region, last for 2 to 3 minutes, not associated with effort related activities nor does it improve with rest.  The symptoms are usually self-limited.  They are worse after eating.  Patient was prescribed Protonix by his primary care provider which helped his symptoms minimally but they are still present.  The fluttering in the chest remains stable and chronic.  He consumes approximately 2 cups of coffee a day, 2 sodas per day, no illicit drug use, no energy drink consumption, no stimulant drugs or known thyroid disease.  Dyspnea on exertion: Patient states that the symptoms have been going on for approximately 2 to 3 months.  Patient states that he was an avid runner in the past but now is concerned given his chest discomfort and effort related dyspnea and wanted a cardiovascular evaluation.  He denies heart failure symptoms of orthopnea, paroxysmal nocturnal dyspnea or lower extremity swelling.  Dizziness: Patient states that recently over the last 6 months he is also  noticed at times episodes of dizziness when he changes positions very quickly.  Patient states that this usually happens during the days when he is on his feet working.  He tries to stay hydrated but because the symptoms have not resolved is concerned.  Denies prior history of coronary artery disease, myocardial infarction, congestive heart failure, deep venous thrombosis, pulmonary embolism, stroke, transient ischemic attack.  FUNCTIONAL STATUS: No structured exercise program or daily routine.    ALLERGIES: No Known Allergies  MEDICATION LIST PRIOR TO VISIT: Current Meds  Medication Sig  . allopurinol (ZYLOPRIM) 100 MG tablet Take 1 tablet (100 mg total) by mouth daily.  . pantoprazole (PROTONIX) 40 MG tablet Take 40 mg by mouth daily.  . pravastatin (PRAVACHOL) 20 MG tablet Take 20 mg by mouth daily.  . sertraline (ZOLOFT) 25 MG tablet Take 25 mg by mouth daily.     PAST MEDICAL HISTORY: Past Medical History:  Diagnosis Date  . High cholesterol     PAST SURGICAL HISTORY: Past Surgical History:  Procedure Laterality Date  . BUNIONECTOMY Right   . TRIGGER FINGER RELEASE    . VASECTOMY      FAMILY HISTORY: The patient family history includes Hypertension in his father; Stroke in his mother.  SOCIAL HISTORY:  The patient  reports that he has never smoked. He has never used smokeless tobacco. He reports that he does not drink alcohol and does not use drugs.  REVIEW OF SYSTEMS: Review of Systems  Constitutional: Negative for chills and fever.  HENT: Negative for hoarse voice  and nosebleeds.   Eyes: Negative for discharge, double vision and pain.  Cardiovascular: Positive for dyspnea on exertion and palpitations. Negative for chest pain, claudication, leg swelling, near-syncope, orthopnea, paroxysmal nocturnal dyspnea and syncope.  Respiratory: Negative for hemoptysis and shortness of breath.   Musculoskeletal: Negative for muscle cramps and myalgias.  Gastrointestinal:  Negative for abdominal pain, constipation, diarrhea, hematemesis, hematochezia, melena, nausea and vomiting.  Neurological: Positive for dizziness. Negative for light-headedness.    PHYSICAL EXAM: Vitals with BMI 05/10/2020 01/15/2017 01/15/2017  Height '5\' 10"'$  - -  Weight 202 lbs - -  BMI 92.11 - -  Systolic 941 740 814  Diastolic 86 85 91  Pulse 64 61 58   Orthostatic VS for the past 72 hrs (Last 3 readings):  Orthostatic BP Patient Position BP Location Cuff Size Orthostatic Pulse  05/10/20 1305 134/89 Standing Left Arm Normal 61  05/10/20 1304 (!) 120/93 Sitting Left Arm Normal 65  05/10/20 1303 133/84 Supine Left Arm Normal 60    CONSTITUTIONAL: Well-developed and well-nourished. No acute distress.  SKIN: Skin is warm and dry. No rash noted. No cyanosis. No pallor. No jaundice HEAD: Normocephalic and atraumatic.  EYES: No scleral icterus MOUTH/THROAT: Moist oral membranes.  NECK: No JVD present. No thyromegaly noted. No carotid bruits  LYMPHATIC: No visible cervical adenopathy.  CHEST Normal respiratory effort. No intercostal retractions.  LUNGS: Clear to auscultation bilaterally.  No stridor. No wheezes. No rales.  CARDIOVASCULAR: Regular, positive S1-S2, no murmurs rubs or gallops appreciated. ABDOMINAL: No apparent ascites.  EXTREMITIES: No peripheral edema  HEMATOLOGIC: No significant bruising NEUROLOGIC: Oriented to person, place, and time. Nonfocal. Normal muscle tone.  PSYCHIATRIC: Normal mood and affect. Normal behavior. Cooperative  CARDIAC DATABASE: EKG: 05/10/2020: Normal sinus rhythm, 61 bpm, normal axis, without underlying ischemia or injury pattern.  Echocardiogram: None  Stress Testing: None  Heart Catheterization: None  LABORATORY DATA: External Labs:  Collected: 04/29/2020 Creatinine 1.4 mg/dL. eGFR: 51 mL/min per 1.73 m Hemoglobin 16.5 g/dL Lipid profile: Total cholesterol 165, triglycerides 105, HDL 43, LDL 101, non-HDL 122 TSH: 1.33    IMPRESSION:    ICD-10-CM   1. Precordial pain  R07.2 PCV MYOCARDIAL PERFUSION WO LEXISCAN    PCV ECHOCARDIOGRAM COMPLETE    SARS-COV-2 RNA,(COVID-19) QUAL NAAT  2. Palpitations  R00.2 EKG 12-Lead    LONG TERM MONITOR (3-14 DAYS)  3. Dyspnea on exertion  R06.00 PCV MYOCARDIAL PERFUSION WO LEXISCAN    PCV ECHOCARDIOGRAM COMPLETE    SARS-COV-2 RNA,(COVID-19) QUAL NAAT  4. OSA on CPAP  G47.33    Z99.89   5. Mixed hyperlipidemia  E78.2      RECOMMENDATIONS: Dylan Rodriguez is a 64 y.o. male with multiple chief complaints presents here for evaluation for atypical chest pain.  No significant past cardiac history except hyperlipidemia.  Precordial chest pain:  Patient symptoms of chest discomfort is atypical in nature.  However in addition to his discomfort he is also experiencing effort related dyspnea.  Patient states that he has been an avid runner and has never had effort related symptoms.  Because of the atypical chest pain and effort related dyspnea he presents to cardiology for further additional cardiovascular evaluation.  Echocardiogram will be ordered to evaluate for structural heart disease and left ventricular systolic function.  Exercise nuclear stress test to evaluate for reversible ischemia  Patient has not had his Covid vaccination and therefore would like to screen him for COVID-19 prior to his exercise stress test.  Dyspnea on exertion: See  above  Palpitations: Recommend 7-day extended Holter monitor to evaluate for underlying arrhythmia.  Obstructive sleep apnea on CPAP: Educated on importance of compliance.  Mixed hyperlipidemia: Currently managed by primary care provider.  FINAL MEDICATION LIST END OF ENCOUNTER: No orders of the defined types were placed in this encounter.   Medications Discontinued During This Encounter  Medication Reason  . allopurinol (ZYLOPRIM) 100 MG tablet Change in therapy  . HYDROcodone-acetaminophen (NORCO) 10-325 MG tablet  Completed Course  . oxyCODONE-acetaminophen (PERCOCET) 10-325 MG tablet Completed Course     Current Outpatient Medications:  .  allopurinol (ZYLOPRIM) 100 MG tablet, Take 1 tablet (100 mg total) by mouth daily., Disp: 30 tablet, Rfl: 6 .  pantoprazole (PROTONIX) 40 MG tablet, Take 40 mg by mouth daily., Disp: , Rfl:  .  pravastatin (PRAVACHOL) 20 MG tablet, Take 20 mg by mouth daily., Disp: , Rfl: 1 .  sertraline (ZOLOFT) 25 MG tablet, Take 25 mg by mouth daily., Disp: , Rfl: 1  Orders Placed This Encounter  Procedures  . SARS-COV-2 RNA,(COVID-19) QUAL NAAT  . PCV MYOCARDIAL PERFUSION WO LEXISCAN  . LONG TERM MONITOR (3-14 DAYS)  . EKG 12-Lead  . PCV ECHOCARDIOGRAM COMPLETE    There are no Patient Instructions on file for this visit.   --Continue cardiac medications as reconciled in final medication list. --Return in about 4 weeks (around 06/07/2020) for re-evaluation of symptoms., review test results.. Or sooner if needed. --Continue follow-up with your primary care physician regarding the management of your other chronic comorbid conditions.  Patient's questions and concerns were addressed to his satisfaction. He voices understanding of the instructions provided during this encounter.   This note was created using a voice recognition software as a result there may be grammatical errors inadvertently enclosed that do not reflect the nature of this encounter. Every attempt is made to correct such errors.  Rex Kras, [place], [facility]  Pager: [phone] Office: [phone]

## 2020-05-16 ENCOUNTER — Ambulatory Visit: Payer: 59

## 2020-05-16 ENCOUNTER — Other Ambulatory Visit: Payer: Self-pay

## 2020-05-16 DIAGNOSIS — R0609 Other forms of dyspnea: Secondary | ICD-10-CM

## 2020-05-16 DIAGNOSIS — R002 Palpitations: Secondary | ICD-10-CM

## 2020-05-16 DIAGNOSIS — R072 Precordial pain: Secondary | ICD-10-CM

## 2020-05-25 ENCOUNTER — Other Ambulatory Visit (HOSPITAL_COMMUNITY)
Admission: RE | Admit: 2020-05-25 | Discharge: 2020-05-25 | Disposition: A | Discharge status: Home / Self Care | Payer: Medicare Other | Source: Ambulatory Visit | Attending: Cardiology | Admitting: Cardiology

## 2020-05-25 DIAGNOSIS — Z20822 Contact with and (suspected) exposure to covid-19: Secondary | ICD-10-CM | POA: Diagnosis not present

## 2020-05-25 DIAGNOSIS — Z01812 Encounter for preprocedural laboratory examination: Secondary | ICD-10-CM | POA: Insufficient documentation

## 2020-05-25 LAB — SARS CORONAVIRUS 2 (TAT 6-24 HRS): SARS Coronavirus 2: NEGATIVE

## 2020-05-27 ENCOUNTER — Other Ambulatory Visit: Payer: Self-pay

## 2020-05-27 ENCOUNTER — Ambulatory Visit: Payer: Medicare Other

## 2020-05-27 DIAGNOSIS — R072 Precordial pain: Secondary | ICD-10-CM

## 2020-05-27 DIAGNOSIS — R0609 Other forms of dyspnea: Secondary | ICD-10-CM

## 2020-05-30 ENCOUNTER — Telehealth: Payer: Self-pay

## 2020-05-30 NOTE — Telephone Encounter (Signed)
-----   Message from [place], [place] sent at 05/30/2020  1:41 PM EDT ----- We will review the results of the upcoming office visit.

## 2020-05-30 NOTE — Telephone Encounter (Signed)
Called pt to give him his results. Pt understood.

## 2020-06-07 ENCOUNTER — Ambulatory Visit: Payer: Medicare Other | Admitting: Cardiology

## 2020-06-07 ENCOUNTER — Encounter: Payer: Self-pay | Admitting: Cardiology

## 2020-06-07 ENCOUNTER — Other Ambulatory Visit: Payer: Self-pay

## 2020-06-07 VITALS — BP 130/87 | HR 62 | Resp 17 | Ht 70.0 in | Wt 202.0 lb

## 2020-06-07 DIAGNOSIS — E782 Mixed hyperlipidemia: Secondary | ICD-10-CM

## 2020-06-07 DIAGNOSIS — R072 Precordial pain: Secondary | ICD-10-CM

## 2020-06-07 DIAGNOSIS — Z712 Person consulting for explanation of examination or test findings: Secondary | ICD-10-CM

## 2020-06-07 DIAGNOSIS — R002 Palpitations: Secondary | ICD-10-CM

## 2020-06-07 DIAGNOSIS — R0609 Other forms of dyspnea: Secondary | ICD-10-CM

## 2020-06-07 DIAGNOSIS — G4733 Obstructive sleep apnea (adult) (pediatric): Secondary | ICD-10-CM

## 2020-06-07 NOTE — Progress Notes (Signed)
Date:  06/08/2020   ID:  BRUCE CHURILLA, DOB 12/16/1955, MRN [id number]  PCP:  Crist Infante, MD  Cardiologist:  Rex Kras, DO, [facility] (established care 05/10/2020)  Date: 06/07/2020 Last Office Visit: 05/10/2020  Chief Complaint  Patient presents with  . percordial  . Palpitations  . Follow-up    4 week  . Results    HPI  Dylan Rodriguez is a 64 y.o. male who presents to the office with a chief complaint of " reevaluate symptoms of review test results."  No significant past cardiac history except hyperlipidemia.  Patient was referred to the office at the request of his primary care provider for evaluation of atypical chest pain and heart flutter.  Since last office visit patient denies any chest discomfort at rest or with effort related activities.  Since then he is ran a 5K without any cardiovascular symptoms.  Patient states that his palpitations have also decreased in intensity and frequency.  No particular pattern settings noticed since last visit.  He states that at times associated with stressful situations.  Patient also states that his dyspnea on exertion has improved compared to before.  And lightheadedness has resolved.  Since last office visit patient has undergone echocardiogram and stress test.  Echocardiogram noted preserved left ventricular systolic function with normal diastolic pattern with mild valvular heart disease.  And stress test noted normal myocardial perfusion and overall low risk study.  Denies prior history of coronary artery disease, myocardial infarction, congestive heart failure, deep venous thrombosis, pulmonary embolism, stroke, transient ischemic attack.  FUNCTIONAL STATUS: No structured exercise program or daily routine.    ALLERGIES: No Known Allergies  MEDICATION LIST PRIOR TO VISIT: Current Meds  Medication Sig  . allopurinol (ZYLOPRIM) 100 MG tablet Take 1 tablet (100 mg total) by mouth daily.  . pantoprazole (PROTONIX) 40 MG tablet Take 40 mg  by mouth daily.   . pravastatin (PRAVACHOL) 20 MG tablet Take 20 mg by mouth daily.  . sertraline (ZOLOFT) 25 MG tablet Take 25 mg by mouth daily.     PAST MEDICAL HISTORY: Past Medical History:  Diagnosis Date  . High cholesterol     PAST SURGICAL HISTORY: Past Surgical History:  Procedure Laterality Date  . BUNIONECTOMY Right   . TRIGGER FINGER RELEASE    . VASECTOMY      FAMILY HISTORY: The patient family history includes Hypertension in his father; Stroke in his mother.  SOCIAL HISTORY:  The patient  reports that he has never smoked. He has never used smokeless tobacco. He reports current alcohol use. He reports that he does not use drugs.  REVIEW OF SYSTEMS: Review of Systems  Constitutional: Negative for chills and fever.  HENT: Negative for hoarse voice and nosebleeds.   Eyes: Negative for discharge, double vision and pain.  Cardiovascular: Positive for dyspnea on exertion (improved.) and palpitations (less frequent. ). Negative for chest pain, claudication, leg swelling, near-syncope, orthopnea, paroxysmal nocturnal dyspnea and syncope.  Respiratory: Negative for hemoptysis and shortness of breath.   Musculoskeletal: Negative for muscle cramps and myalgias.  Gastrointestinal: Negative for abdominal pain, constipation, diarrhea, hematemesis, hematochezia, melena, nausea and vomiting.  Neurological: Negative for dizziness and light-headedness.    PHYSICAL EXAM: Vitals with BMI 06/07/2020 05/10/2020 01/15/2017  Height '5\' 10"'$  '5\' 10"'$  -  Weight 202 lbs 202 lbs -  BMI 76.16 07.37 -  Systolic 106 269 485  Diastolic 87 86 85  Pulse 62 64 61   Orthostatic VS for the  past 72 hrs (Last 3 readings):  Patient Position BP Location Cuff Size  06/07/20 1107 Sitting Left Arm Normal    CONSTITUTIONAL: Well-developed and well-nourished. No acute distress.  SKIN: Skin is warm and dry. No rash noted. No cyanosis. No pallor. No jaundice HEAD: Normocephalic and atraumatic.  EYES: No  scleral icterus MOUTH/THROAT: Moist oral membranes.  NECK: No JVD present. No thyromegaly noted. No carotid bruits  LYMPHATIC: No visible cervical adenopathy.  CHEST Normal respiratory effort. No intercostal retractions.  LUNGS: Clear to auscultation bilaterally.  No stridor. No wheezes. No rales.  CARDIOVASCULAR: Regular, positive S1-S2, no murmurs rubs or gallops appreciated. ABDOMINAL: No apparent ascites.  EXTREMITIES: No peripheral edema  HEMATOLOGIC: No significant bruising NEUROLOGIC: Oriented to person, place, and time. Nonfocal. Normal muscle tone.  PSYCHIATRIC: Normal mood and affect. Normal behavior. Cooperative  CARDIAC DATABASE: EKG: 05/10/2020: Normal sinus rhythm, 61 bpm, normal axis, without underlying ischemia or injury pattern.  Echocardiogram: 05/16/2020: LVEF 60-65%, mild LVH, normal diastolic filling pattern, normal LAP, mildly dilated right atrium, mildly dilated right ventricle with normal ventricular function, mild MR, mild TR, no pulmonary hypertension.  Stress Testing: Lexiscan (Walking with Camelia Phenes) Sestamibi Stress Test 05/28/2020:  Myocardial perfusion is normal.  Overall LV systolic function is normal without regional wall motion abnormalities.  Stress LV EF: 71%.  No previous exam available for comparison. Low risk.  Heart Catheterization: None  LABORATORY DATA: External Labs:  Collected: 04/29/2020 Creatinine 1.4 mg/dL. eGFR: 51 mL/min per 1.73 m Hemoglobin 16.5 g/dL Lipid profile: Total cholesterol 165, triglycerides 105, HDL 43, LDL 101, non-HDL 122 TSH: 9.69   IMPRESSION:    ICD-10-CM   1. Precordial pain  R07.2   2. Dyspnea on exertion  R06.00   3. Palpitations  R00.2   4. OSA on CPAP  G47.33    Z99.89   5. Mixed hyperlipidemia  E78.2   6. Encounter to discuss test results  Z71.2      RECOMMENDATIONS: Dylan Rodriguez is a 64 y.o. male with multiple chief complaints presents here for evaluation for atypical chest pain.  No significant  past cardiac history except hyperlipidemia.  Precordial chest pain and dyspnea on exertion:  Patient symptoms of chest pain and dyspnea on exertion do not appear to be cardiac in origin.  Since last visit patient states that he has not had chest pain.  He has also ran a 5K without cardiovascular symptoms.  His shortness of breath with effort related activities have improved.  But still present.    Echocardiogram noted preserved left ventricular systolic function without any significant valvular heart disease.  And nuclear stress test was overall low risk study with a normal myocardial perfusion.    Educated on the importance of risk factor modifications for his modifiable cardiovascular risk factors.   Palpitations:  At the last office visit patient was recommended to have a 7-day extended Holter monitor to evaluate for underlying arrhythmia.  However, the results are not available the data still needs to be processed.  Once the report is available and read I will have the office contact the patient to update him of the results.    Obstructive sleep apnea on CPAP: Educated on importance of compliance.  Mixed hyperlipidemia: Currently managed by primary care provider.  FINAL MEDICATION LIST END OF ENCOUNTER: No orders of the defined types were placed in this encounter.   There are no discontinued medications.   Current Outpatient Medications:  .  allopurinol (ZYLOPRIM) 100 MG tablet, Take  1 tablet (100 mg total) by mouth daily., Disp: 30 tablet, Rfl: 6 .  pantoprazole (PROTONIX) 40 MG tablet, Take 40 mg by mouth daily. , Disp: , Rfl:  .  pravastatin (PRAVACHOL) 20 MG tablet, Take 20 mg by mouth daily., Disp: , Rfl: 1 .  sertraline (ZOLOFT) 25 MG tablet, Take 25 mg by mouth daily., Disp: , Rfl: 1  No orders of the defined types were placed in this encounter.   There are no Patient Instructions on file for this visit.   --Continue cardiac medications as reconciled in final medication  list. --Return in about 1 year (around 06/07/2021) for Valvular heart disease. . Or sooner if needed. --Continue follow-up with your primary care physician regarding the management of your other chronic comorbid conditions.  Patient's questions and concerns were addressed to his satisfaction. He voices understanding of the instructions provided during this encounter.   Total time spent: 20 minutes.  This note was created using a voice recognition software as a result there may be grammatical errors inadvertently enclosed that do not reflect the nature of this encounter. Every attempt is made to correct such errors.  Rex Kras, [place], [facility]  Pager: [phone] Office: [phone]

## 2020-06-24 ENCOUNTER — Telehealth: Payer: Self-pay

## 2020-06-24 NOTE — Telephone Encounter (Signed)
Called pt to inform him about his monitor and stress test with echo. Pt understood

## 2020-06-24 NOTE — Telephone Encounter (Signed)
-----   Message from [place], [place] sent at 06/23/2020  2:43 PM EDT ----- Please inform the patient that his monitor results noted that his average heart rate is 68 bpm.He had one episode of ventricular tachycardia.  He is already had a nuclear stress test which was normal and echocardiogram which noted preserved LVEF, pumping activity of the heart.  We will continue to monitor.No evidence of atrial fibrillation.Please let me know if he has any questions regarding his monitor results.  If so I will give him a call to explain the details.  Otherwise I will see him back in 1 year follow-up visit as already scheduled.

## 2021-06-06 ENCOUNTER — Ambulatory Visit: Payer: Medicare Other | Admitting: Cardiology

## 2021-06-06 DIAGNOSIS — R0600 Dyspnea, unspecified: Secondary | ICD-10-CM

## 2021-06-06 DIAGNOSIS — R002 Palpitations: Secondary | ICD-10-CM

## 2021-06-06 DIAGNOSIS — G4733 Obstructive sleep apnea (adult) (pediatric): Secondary | ICD-10-CM

## 2021-06-06 DIAGNOSIS — E782 Mixed hyperlipidemia: Secondary | ICD-10-CM

## 2021-06-06 DIAGNOSIS — R072 Precordial pain: Secondary | ICD-10-CM

## 2021-08-19 ENCOUNTER — Other Ambulatory Visit: Payer: Self-pay | Admitting: Internal Medicine

## 2021-08-19 DIAGNOSIS — E785 Hyperlipidemia, unspecified: Secondary | ICD-10-CM

## 2021-09-10 ENCOUNTER — Ambulatory Visit
Admission: RE | Admit: 2021-09-10 | Discharge: 2021-09-10 | Disposition: A | Discharge status: Home / Self Care | Payer: No Typology Code available for payment source | Source: Ambulatory Visit | Attending: Internal Medicine | Admitting: Internal Medicine

## 2021-09-10 DIAGNOSIS — E785 Hyperlipidemia, unspecified: Secondary | ICD-10-CM
